# Patient Record
Sex: Male | Born: 1965 | Race: White | Hispanic: No | Marital: Married | Smoking: Never smoker
Health system: Southern US, Community
[De-identification: ages and names within clinical notes are randomized; demographics above are authoritative.]

## PROBLEM LIST (undated history)

## (undated) DIAGNOSIS — F319 Bipolar disorder, unspecified: Secondary | ICD-10-CM

## (undated) HISTORY — PX: WISDOM TOOTH EXTRACTION: SHX21

## (undated) HISTORY — PX: TONSILLECTOMY: SUR1361

---

## 2016-10-12 ENCOUNTER — Encounter: Payer: Self-pay | Admitting: Emergency Medicine

## 2016-10-12 ENCOUNTER — Emergency Department: Payer: BC Managed Care – PPO

## 2016-10-12 ENCOUNTER — Emergency Department
Admission: EM | Admit: 2016-10-12 | Discharge: 2016-10-12 | Disposition: A | Discharge status: Home / Self Care | Payer: BC Managed Care – PPO | Attending: Emergency Medicine | Admitting: Emergency Medicine

## 2016-10-12 DIAGNOSIS — K7689 Other specified diseases of liver: Secondary | ICD-10-CM | POA: Insufficient documentation

## 2016-10-12 DIAGNOSIS — R109 Unspecified abdominal pain: Secondary | ICD-10-CM | POA: Diagnosis present

## 2016-10-12 DIAGNOSIS — N200 Calculus of kidney: Secondary | ICD-10-CM | POA: Diagnosis not present

## 2016-10-12 HISTORY — DX: Bipolar disorder, unspecified: F31.9

## 2016-10-12 LAB — BASIC METABOLIC PANEL
ANION GAP: 8 (ref 5–15)
BUN: 9 mg/dL (ref 6–20)
CALCIUM: 9.7 mg/dL (ref 8.9–10.3)
CO2: 25 mmol/L (ref 22–32)
Chloride: 105 mmol/L (ref 101–111)
Creatinine, Ser: 0.86 mg/dL (ref 0.61–1.24)
GFR calc Af Amer: >60 mL/min (ref >60)
GLUCOSE: 124 mg/dL — AB (ref 65–99)
POTASSIUM: 3.8 mmol/L (ref 3.5–5.1)
Sodium: 138 mmol/L (ref 135–145)

## 2016-10-12 LAB — URINALYSIS, COMPLETE (UACMP) WITH MICROSCOPIC
BACTERIA UA: NONE SEEN
Bilirubin Urine: NEGATIVE
GLUCOSE, UA: NEGATIVE mg/dL
KETONES UR: 5 mg/dL — AB
LEUKOCYTES UA: NEGATIVE
NITRITE: NEGATIVE
PH: 5 (ref 5.0–8.0)
PROTEIN: NEGATIVE mg/dL
SQUAMOUS EPITHELIAL / LPF: NONE SEEN
Specific Gravity, Urine: 1.021 (ref 1.005–1.030)

## 2016-10-12 LAB — CBC
HEMATOCRIT: 42.8 % (ref 40.0–52.0)
HEMOGLOBIN: 14.6 g/dL (ref 13.0–18.0)
MCH: 30.7 pg (ref 26.0–34.0)
MCHC: 34.1 g/dL (ref 32.0–36.0)
MCV: 89.8 fL (ref 80.0–100.0)
Platelets: 210 10*3/uL (ref 150–440)
RBC: 4.77 MIL/uL (ref 4.40–5.90)
RDW: 14.4 % (ref 11.5–14.5)
WBC: 8.6 10*3/uL (ref 3.8–10.6)

## 2016-10-12 MED ORDER — MORPHINE SULFATE (PF) 4 MG/ML IV SOLN
4.0000 mg | Freq: Once | INTRAVENOUS | Status: AC
  Administered 2016-10-12: 4 mg via INTRAVENOUS
  Filled 2016-10-12: qty 1

## 2016-10-12 MED ORDER — TAMSULOSIN HCL 0.4 MG PO CAPS: ORAL_CAPSULE | ORAL | 0 refills | Status: AC

## 2016-10-12 MED ORDER — OXYCODONE-ACETAMINOPHEN 5-325 MG PO TABS
2.0000 | ORAL_TABLET | Freq: Once | ORAL | Status: AC
  Administered 2016-10-12: 2 via ORAL
  Filled 2016-10-12: qty 2

## 2016-10-12 MED ORDER — ONDANSETRON 4 MG PO TBDP: ORAL_TABLET | ORAL | 0 refills | Status: AC

## 2016-10-12 MED ORDER — ONDANSETRON HCL 4 MG/2ML IJ SOLN
4.0000 mg | INTRAMUSCULAR | Status: AC
  Administered 2016-10-12: 4 mg via INTRAVENOUS
  Filled 2016-10-12: qty 2

## 2016-10-12 MED ORDER — DOCUSATE SODIUM 100 MG PO CAPS: ORAL_CAPSULE | ORAL | 0 refills | Status: AC

## 2016-10-12 MED ORDER — OXYCODONE-ACETAMINOPHEN 5-325 MG PO TABS: 1.0000 | ORAL_TABLET | ORAL | 0 refills | Status: AC | PRN

## 2016-10-12 NOTE — ED Notes (Signed)
NAD noted at time of D/C. Pt denies questions or concerns. Pt ambulatory to the lobby at this time. Pt refused wheelchair to the lobby.  

## 2016-10-12 NOTE — Discharge Instructions (Signed)
You have been seen in the Emergency Department (ED) today for pain that we believe based on your workup, is caused by kidney stones.  As we have discussed, please drink plenty of fluids.  Please make a follow up appointment with the physician(s) listed elsewhere in this documentation.  You may take pain medication as needed but ONLY as prescribed.  Please also take your prescribed Flomax daily.  We also recommend that you take over-the-counter ibuprofen regularly according to label instructions over the next 5 days.  Take it with meals to minimize stomach discomfort.  Please see your doctor as soon as possible as stones may take 1-3 weeks to pass and you may require additional care or medications.  Do not drink alcohol, drive or participate in any other potentially dangerous activities while taking opiate pain medication as it may make you sleepy. Do not take this medication with any other sedating medications, either prescription or over-the-counter. If you were prescribed Percocet or Vicodin, do not take these with acetaminophen (Tylenol) as it is already contained within these medications.   Take Percocet as needed for severe pain.  This medication is an opiate (or narcotic) pain medication and can be habit forming.  Use it as little as possible to achieve adequate pain control.  Do not use or use it with extreme caution if you have a history of opiate abuse or dependence.  If you are on a pain contract with your primary care doctor or a pain specialist, be sure to let them know you were prescribed this medication today from the [facility].  This medication is intended for your use only - do not give any to anyone else and keep it in a secure place where nobody else, especially children, have access to it.  It will also cause or worsen constipation, so you may want to consider taking an over-the-counter stool softener while you are taking this medication.  Additionally, as we  discussed, you have a number of small cysts in your liver.  These are most likely benign, but you may want to discuss it with your regular doctor to discuss whether or not additional outpatient imaging (such as an MRI) is recommended.  Return to the Emergency Department (ED) or call your doctor if you have any worsening pain, fever, painful urination, are unable to urinate, or develop other symptoms that concern you.

## 2016-10-12 NOTE — ED Triage Notes (Signed)
Pt. To ED by [facility], pt states lower abdominal pain that began yesterday associated with burning and frequent urination,

## 2016-10-12 NOTE — ED Notes (Signed)
Pt taken to CT.

## 2016-10-12 NOTE — ED Notes (Signed)
Pt resting in bed with family member at bedside at this time. States pain 2/10. Denies any needs at this time. Will continue to monitor for further patient needs. Pt is alert and oriented, VSS and WNL.

## 2016-10-12 NOTE — ED Notes (Signed)
Pt returned from CT at this time.  

## 2016-10-12 NOTE — ED Notes (Signed)
Pt resting in bed at this time with friend at bedside. Pt states the pain is still under control. Denies any needs at this time. Will continue to monitor for further patient needs.

## 2016-10-12 NOTE — ED Provider Notes (Signed)
[facility] Emergency Department Provider Note  ____________________________________________   First MD Initiated Contact with Patient 10/12/16 1402     (approximate)  I have reviewed the triage vital signs and the nursing notes.   HISTORY  Chief Complaint Urinary Frequency    HPI Clifford Bennett is a 50 y.o. male with no significant history of medical illness except for bipolar disorder who presents for evaluation of several days of some urinary hesitancy, burning with urination, and today with acute onset of left flank pain.  He does not have a history of kidney stones.  He states that he goes to annual physical exams and thinks that he may be developing some issues with his prostate but in general this is a new feeling that he has not had before.  He denies fever/chills, chest pain, shortness of breath.  He has had some nausea but no vomiting.  He has a little bit of pain in the left lower part of his abdomen and this seems to be radiating around from his flank.  He has chronic constipation and that has not changed recently.  Nothing in particular makes symptoms better or worse.  The pain is described as sharp and stabbing and waxes and wanes in intensity.   Past Medical History:  Diagnosis Date  . Bipolar disorder (HCC)     There are no active problems to display for this patient.   Past Surgical History:  Procedure Laterality Date  . TONSILLECTOMY    . WISDOM TOOTH EXTRACTION      Prior to Admission medications   Medication Sig Start Date End Date Taking? Authorizing Provider  docusate sodium (COLACE) 100 MG capsule Take 1 tablet once or twice daily as needed for constipation while taking narcotic pain medicine 10/12/16   Loleta Roseory Lizmary Nader, MD  ondansetron (ZOFRAN ODT) 4 MG disintegrating tablet Allow 1-2 tablets to dissolve in your mouth every 8 hours as needed for nausea/vomiting 10/12/16   Loleta Roseory Kimberlie Csaszar, MD  oxyCODONE-acetaminophen (ROXICET) 5-325 MG  tablet Take 1-2 tablets by mouth every 4 (four) hours as needed for severe pain. 10/12/16   Loleta Roseory Brendolyn Stockley, MD  tamsulosin (FLOMAX) 0.4 MG CAPS capsule Take 1 tablet by mouth daily until you pass the kidney stone or no longer have symptoms 10/12/16   Loleta Roseory Silvano Garofano, MD    Allergies Patient has no known allergies.  History reviewed. No pertinent family history.  Social History Social History  Substance Use Topics  . Smoking status: Never Smoker  . Smokeless tobacco: Never Used  . Alcohol use No    Review of Systems Constitutional: No fever/chills Eyes: No visual changes. ENT: No sore throat. Cardiovascular: Denies chest pain. Respiratory: Denies shortness of breath. Gastrointestinal: Left flank pain that radiates to left side of abdomen.  nausea, no vomiting.  No diarrhea.  No constipation. Genitourinary: +mild burning dysuria.  +urine hesitancy.   Musculoskeletal: Left flank pain Skin: Negative for rash. Neurological: Negative for headaches, focal weakness or numbness.  10-point ROS otherwise negative.  ____________________________________________   PHYSICAL EXAM:  VITAL SIGNS: ED Triage Vitals  Enc Vitals Group     BP 10/12/16 1316 (!) 137/95     Pulse Rate 10/12/16 1316 74     Resp 10/12/16 1316 13     Temp 10/12/16 1316 97.8 F (36.6 C)     Temp Source 10/12/16 1316 Oral     SpO2 10/12/16 1316 100 %     Weight 10/12/16 1317 170 lb (77.1 kg)  Height 10/12/16 1317 5\' 7"  (1.702 m)     Head Circumference --      Peak Flow --      Pain Score 10/12/16 1317 7     Pain Loc --      Pain Edu? --      Excl. in GC? --     Constitutional: Alert and oriented. Well appearing and in no acute distress. Eyes: Conjunctivae are normal. PERRL. EOMI. Head: Atraumatic. Nose: No congestion/rhinnorhea. Mouth/Throat: Mucous membranes are moist.  Oropharynx non-erythematous. Neck: No stridor.  No meningeal signs.   Cardiovascular: Normal rate, regular rhythm. Good peripheral  circulation. Grossly normal heart sounds. Respiratory: Normal respiratory effort.  No retractions. Lungs CTAB. Gastrointestinal: Soft and nontender. No distention. Moderate Left CVA tenderness GU:  Normal external physical exam with no tenderness to palpation of the penis or testicles.  There is no discharge at the urethral meatus Musculoskeletal: No lower extremity tenderness nor edema. No gross deformities of extremities. Neurologic:  Normal speech and language. No gross focal neurologic deficits are appreciated.  Skin:  Skin is warm, dry and intact. No rash noted. Psychiatric: Mood and affect are normal. Speech and behavior are normal.  ____________________________________________   LABS (all labs ordered are listed, but only abnormal results are displayed)  Labs Reviewed  URINALYSIS, COMPLETE (UACMP) WITH MICROSCOPIC - Abnormal; Notable for the following:       Result Value   Color, Urine YELLOW (*)    APPearance CLEAR (*)    Hgb urine dipstick LARGE (*)    Ketones, ur 5 (*)    All other components within normal limits  BASIC METABOLIC PANEL - Abnormal; Notable for the following:    Glucose, Bld 124 (*)    All other components within normal limits  URINE CULTURE  CBC   ____________________________________________  EKG  None - EKG not ordered by ED physician ____________________________________________  RADIOLOGY   Ct Renal Stone Study  Result Date: 10/12/2016 CLINICAL DATA:  Left flank pain since last night with hematuria, no surgical hx EXAM: CT ABDOMEN AND PELVIS WITHOUT CONTRAST TECHNIQUE: Multidetector CT imaging of the abdomen and pelvis was performed following the standard protocol without IV contrast. COMPARISON:  None. FINDINGS: Lower chest: No pulmonary nodules, pleural effusions, or infiltrates. Heart size is normal. No imaged pericardial effusion or significant coronary artery calcifications. Hepatobiliary: No focal liver abnormality is seen. No gallstones,  gallbladder wall thickening, or biliary dilatation.There are numerous low-attenuation lesions throughout the liver, measuring 1 cm and smaller. These are not fully characterized given the lack of intravenous contrast. Gallbladder is present and normal in CT appearance. Pancreas: Unremarkable. No pancreatic ductal dilatation or surrounding inflammatory changes. Spleen: Normal in size without focal abnormality. Adrenals/Urinary Tract: Adrenal glands are normal in appearance. There is a 1 mm stone at the left ureterovesical junction, associated with significant surrounding ureteral edema. There is moderate left-sided hydronephrosis and perinephric stranding. The right kidney and ureter are normal in appearance. No intrarenal stones are identified. Stomach/Bowel: The stomach and small bowel loops are normal in appearance. The appendix is well seen and has a normal appearance. Colonic loops are normal in appearance. Vascular/Lymphatic: Minimal atherosclerotic calcification of the abdominal aorta. No aneurysm. No retroperitoneal or mesenteric adenopathy. Reproductive: The prostate gland and seminal vesicles are normal in appearance. Other: No abdominal wall hernia or abnormality. No abdominopelvic ascites. Musculoskeletal: No acute or significant osseous findings. IMPRESSION: 1. Obstructing left ureterovesical junction calculus measuring 1 mm. 2. Numerous hepatic lesions measuring 1 mm  cm and smaller, not further characterized. In the absence of known malignancy, these are favored to be benign hepatic cysts and/or hemangiomata. If the patient has a known malignancy, further evaluation would be warranted. If the patient has a known malignancy, hepatic protocol MRI would be recommended. 3.  Aortic atherosclerosis. 4. Normal appendix. Electronically Signed   By: Norva PavlovElizabeth  Brown M.D.   On: 10/12/2016 15:06    ____________________________________________   PROCEDURES  Procedure(s) performed:    Procedures   Critical Care performed: No ____________________________________________   INITIAL IMPRESSION / ASSESSMENT AND PLAN / ED COURSE  Pertinent labs & imaging results that were available during my care of the patient were reviewed by me and considered in my medical decision making (see chart for details).  The patient's signs and symptoms could be from an STD but I felt that kidney stone was more likely.  CT renal stone protocol confirmed presence of a small stone which she should be able to pass.  His pain was well controlled after morphine.  There is no evidence of any other acute or emergent medical condition at this time. I had my usual and customary discussion and gave my usual return precautions.  I also discussed the findings of the numerous probably benign cysts in his liver and provided the results of the CT scan so that he can take it back to his primary care doctor who lives 3 and half hours from here.   Clinical Course as of Oct 12 1738  Wynelle LinkSun Oct 12, 2016  1731 Patient still with mild tachycardia (about 110), but may be related to discomfort.  Remains afebrile with reassuring workup except for the kidney stone and (likely benign) liver cysts.  Okay for discharge.  [CF]    Clinical Course User Index [CF] Loleta Roseory Oddie Bottger, MD    ____________________________________________  FINAL CLINICAL IMPRESSION(S) / ED DIAGNOSES  Final diagnoses:  Kidney stone  Liver cyst     MEDICATIONS GIVEN DURING THIS VISIT:  Medications  morphine 4 MG/ML injection 4 mg (4 mg Intravenous Given 10/12/16 1503)  ondansetron (ZOFRAN) injection 4 mg (4 mg Intravenous Given 10/12/16 1503)  oxyCODONE-acetaminophen (PERCOCET/ROXICET) 5-325 MG per tablet 2 tablet (2 tablets Oral Given 10/12/16 1712)     NEW OUTPATIENT MEDICATIONS STARTED DURING THIS VISIT:  New Prescriptions   DOCUSATE SODIUM (COLACE) 100 MG CAPSULE    Take 1 tablet once or twice daily as needed for constipation while  taking narcotic pain medicine   ONDANSETRON (ZOFRAN ODT) 4 MG DISINTEGRATING TABLET    Allow 1-2 tablets to dissolve in your mouth every 8 hours as needed for nausea/vomiting   OXYCODONE-ACETAMINOPHEN (ROXICET) 5-325 MG TABLET    Take 1-2 tablets by mouth every 4 (four) hours as needed for severe pain.   TAMSULOSIN (FLOMAX) 0.4 MG CAPS CAPSULE    Take 1 tablet by mouth daily until you pass the kidney stone or no longer have symptoms    Modified Medications   No medications on file    Discontinued Medications   No medications on file     Note:  This document was prepared using Dragon voice recognition software and may include unintentional dictation errors.    Loleta Roseory Alanny Rivers, MD 10/12/16 1739

## 2016-10-13 LAB — URINE CULTURE: Culture: NO GROWTH

## 2018-02-14 IMAGING — CT CT RENAL STONE PROTOCOL
2 of 4 series · 16 of 46 positions shown, 18 images · non-contrast
Comparison: None.

CLINICAL DATA: Left flank pain since last night with hematuria, no
surgical hx

EXAM:
CT ABDOMEN AND PELVIS WITHOUT CONTRAST
TECHNIQUE: Multidetector CT imaging of the abdomen and pelvis was performed
following the standard protocol without IV contrast.

[Series 2: stone full standard · axial · 0.74mm/px · z∈[-789,-379]mm · 13 of 90 slices shown, 15 images]
[im 4/90  soft-tissue]
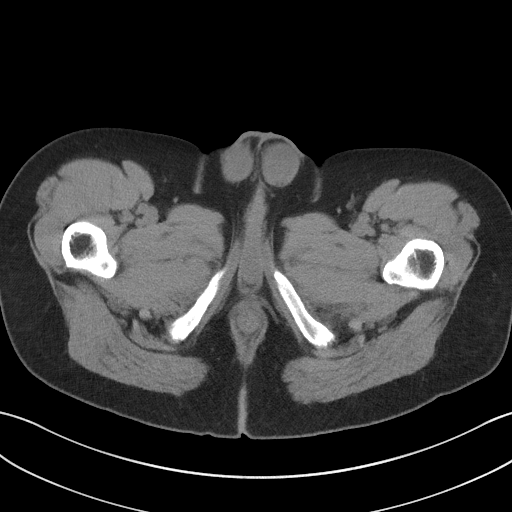
[im 4/90  bone]
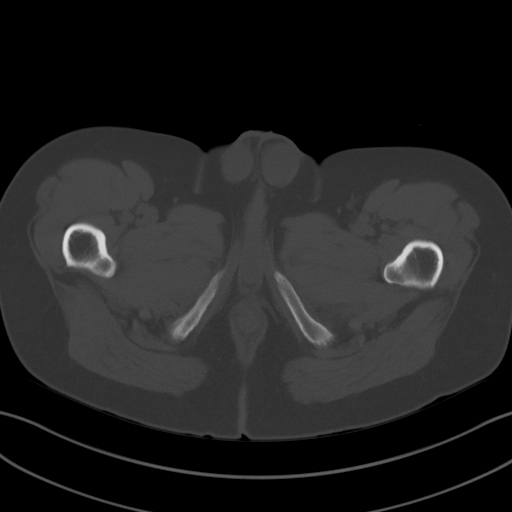
[im 11/90  soft-tissue]
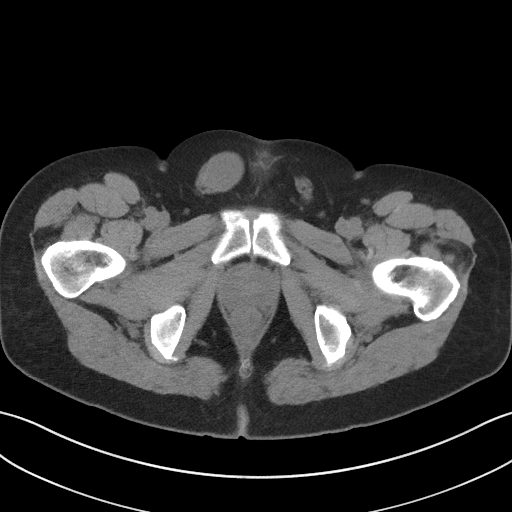
[im 18/90  soft-tissue]
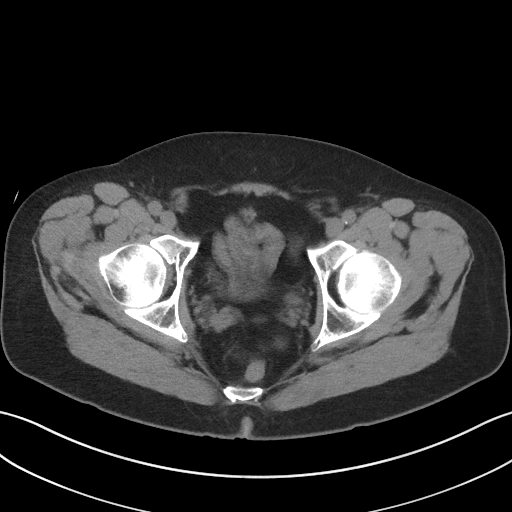
[im 25/90  soft-tissue]
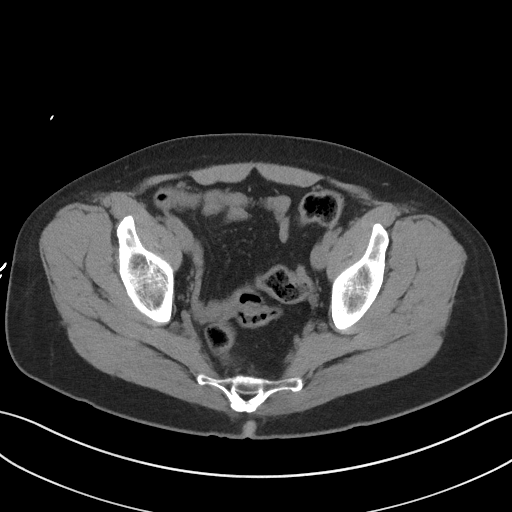
[im 33/90  soft-tissue]
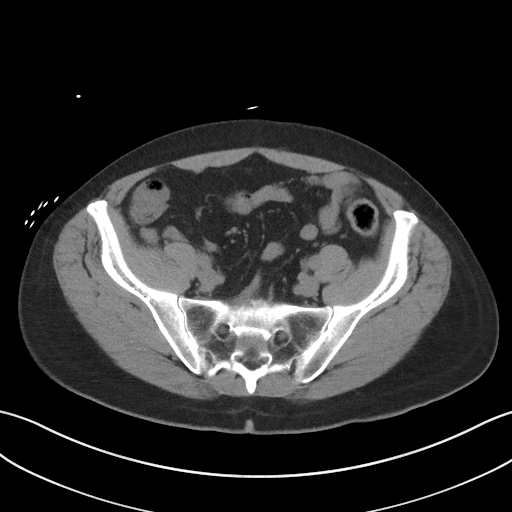
[im 40/90  soft-tissue]
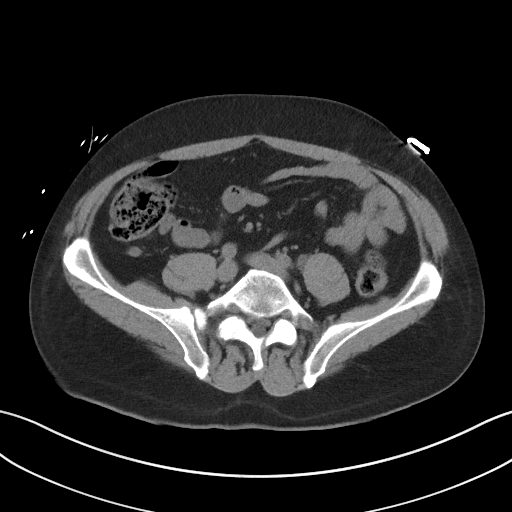
[im 47/90  soft-tissue]
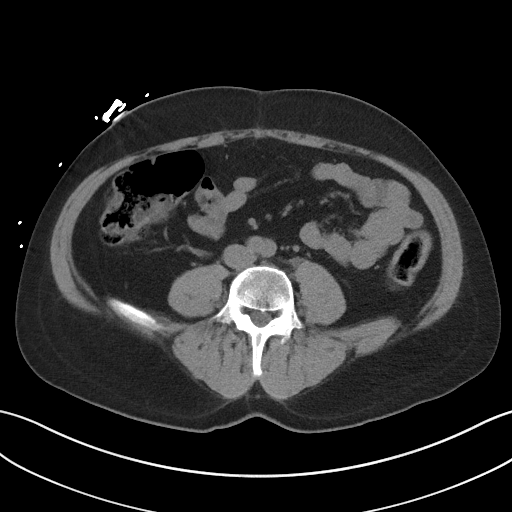
[im 50/90  soft-tissue]
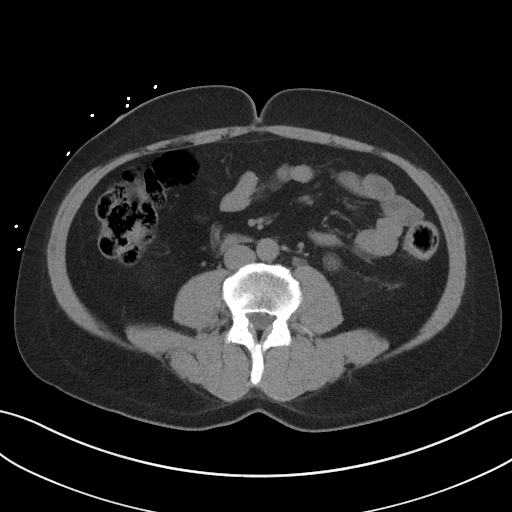
[im 57/90  soft-tissue]
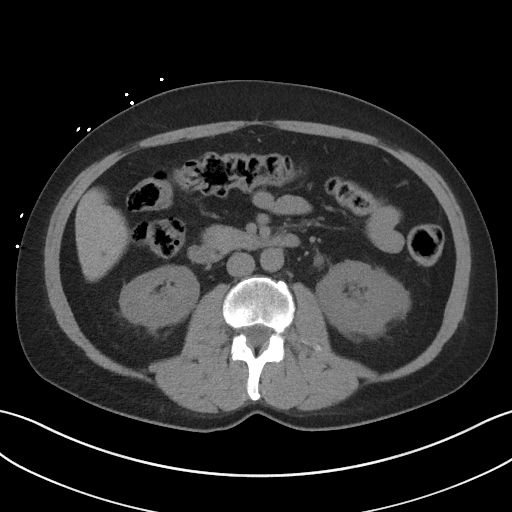
[im 57/90  bone]
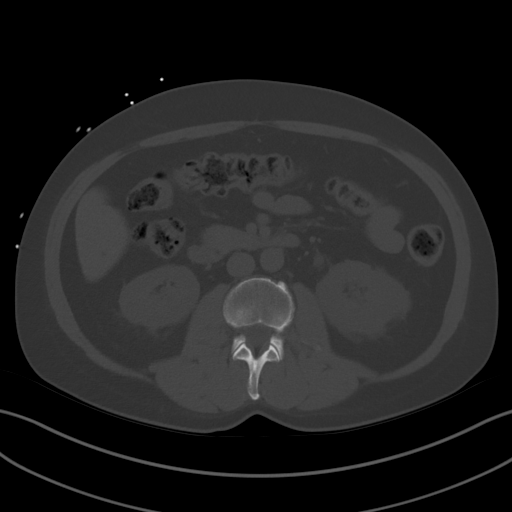
[im 65/90  soft-tissue]
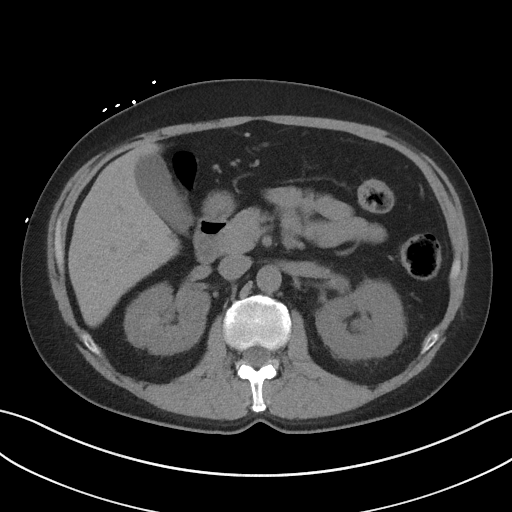
[im 72/90  soft-tissue]
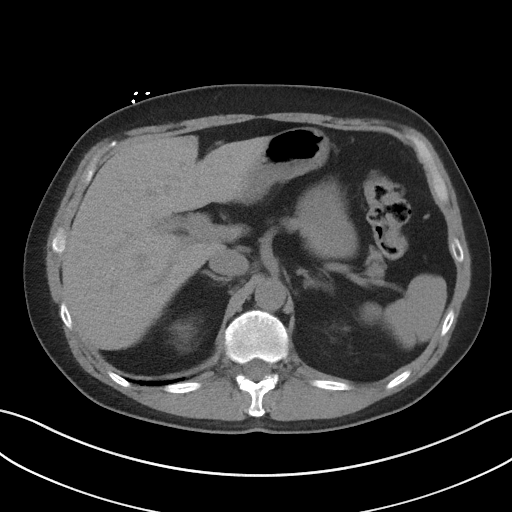
[im 79/90  soft-tissue]
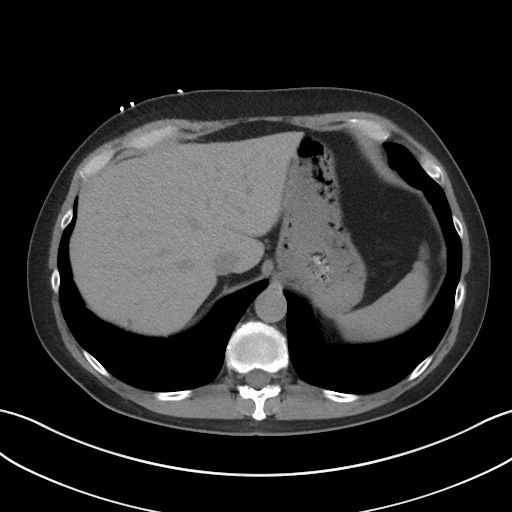
[im 86/90  soft-tissue]
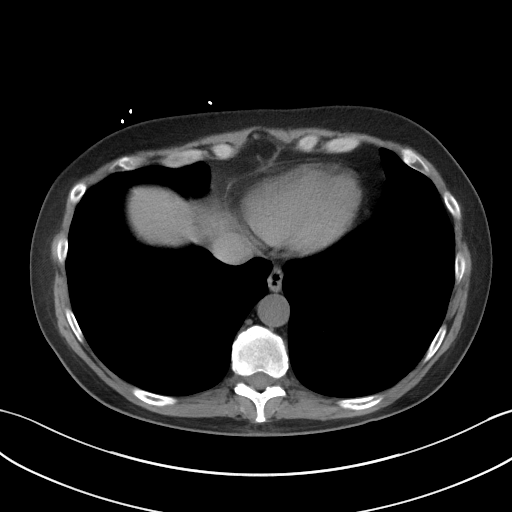

[Series 5: coronal · coronal · 0.75mm/px · 3 of 134 slices shown]
[im 45/134  soft-tissue]
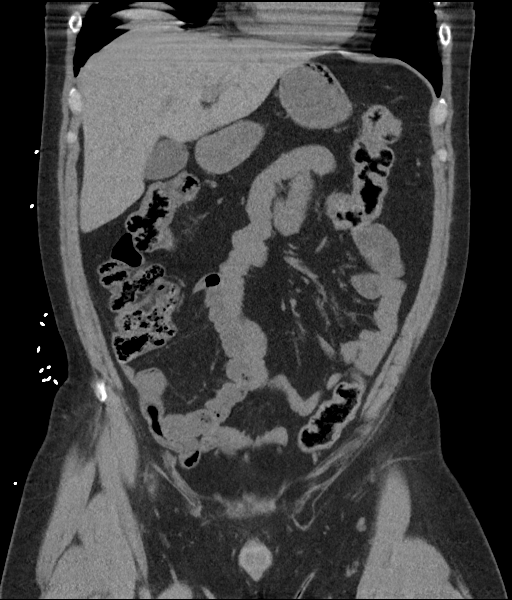
[im 60/134  soft-tissue]
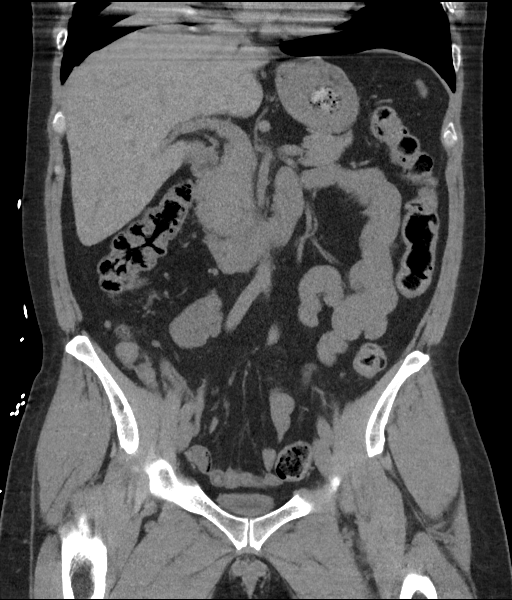
[im 74/134  soft-tissue]
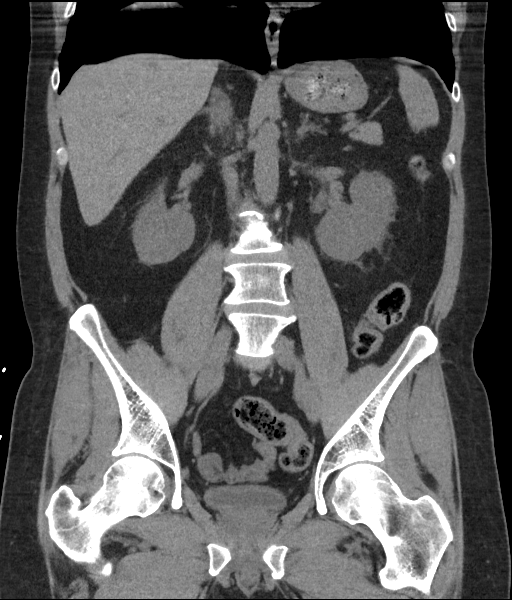

[16 of 46 positions shown; findings below may reference images not displayed]

FINDINGS: Lower chest: No pulmonary nodules, pleural effusions, or
infiltrates. Heart size is normal. No imaged pericardial effusion or
significant coronary artery calcifications.

Hepatobiliary: No focal liver abnormality is seen. No gallstones,
gallbladder wall thickening, or biliary dilatation.There are
numerous low-attenuation lesions throughout the liver, measuring 1
cm and smaller. These are not fully characterized given the lack of
intravenous contrast. Gallbladder is present and normal in CT
appearance.

Pancreas: Unremarkable. No pancreatic ductal dilatation or
surrounding inflammatory changes.

Spleen: Normal in size without focal abnormality.

Adrenals/Urinary Tract: Adrenal glands are normal in appearance.

There is a 1 mm stone at the left ureterovesical junction,
associated with significant surrounding ureteral edema. There is
moderate left-sided hydronephrosis and perinephric stranding. The
right kidney and ureter are normal in appearance. No intrarenal
stones are identified.

Stomach/Bowel: The stomach and small bowel loops are normal in
appearance. The appendix is well seen and has a normal appearance.
Colonic loops are normal in appearance.

Vascular/Lymphatic: Minimal atherosclerotic calcification of the
abdominal aorta. No aneurysm. No retroperitoneal or mesenteric
adenopathy.

Reproductive: The prostate gland and seminal vesicles are normal in
appearance.

Other: No abdominal wall hernia or abnormality. No abdominopelvic
ascites.

Musculoskeletal: No acute or significant osseous findings.
IMPRESSION: 1. Obstructing left ureterovesical junction calculus measuring 1 mm.
2. Numerous hepatic lesions measuring 1 mm cm and smaller, not
further characterized. In the absence of known malignancy, these are
favored to be benign hepatic cysts and/or hemangiomata. If the
patient has a known malignancy, further evaluation would be
warranted. If the patient has a known malignancy, hepatic protocol
MRI would be recommended.
3.  Aortic atherosclerosis.
4. Normal appendix.
